# Patient Record
Sex: Male | Born: 1985 | Race: White | Hispanic: No | Marital: Married | Smoking: Former smoker
Health system: Southern US, Community
[De-identification: ages and names within clinical notes are randomized; demographics above are authoritative.]

## PROBLEM LIST (undated history)

## (undated) DIAGNOSIS — F32A Depression, unspecified: Secondary | ICD-10-CM

## (undated) DIAGNOSIS — F329 Major depressive disorder, single episode, unspecified: Secondary | ICD-10-CM

## (undated) HISTORY — DX: Depression, unspecified: F32.A

## (undated) HISTORY — DX: Major depressive disorder, single episode, unspecified: F32.9

---

## 2007-02-27 ENCOUNTER — Emergency Department: Payer: Self-pay | Admitting: Emergency Medicine

## 2011-04-30 ENCOUNTER — Ambulatory Visit: Payer: Self-pay | Admitting: Internal Medicine

## 2011-11-26 IMAGING — CR DG SHOULDER 3+V*R*
1 series · 4 of 4 positions shown · non-contrast
Comparison: none

REASON FOR EXAM: sudden pain radiating to elbow after swinging baseball
bat
COMMENTS:   LMP: (Male)

PROCEDURE:     MDR - MDR SHOULDER RIGHT COMPLETE  - April 30, 2011  [DATE]
RESULT:     There is no evidence of fracture, dislocation, or malalignment.

[Series 1: view not recorded · 0.17mm/px · 4 of 4 slices shown]
[im 1/4]
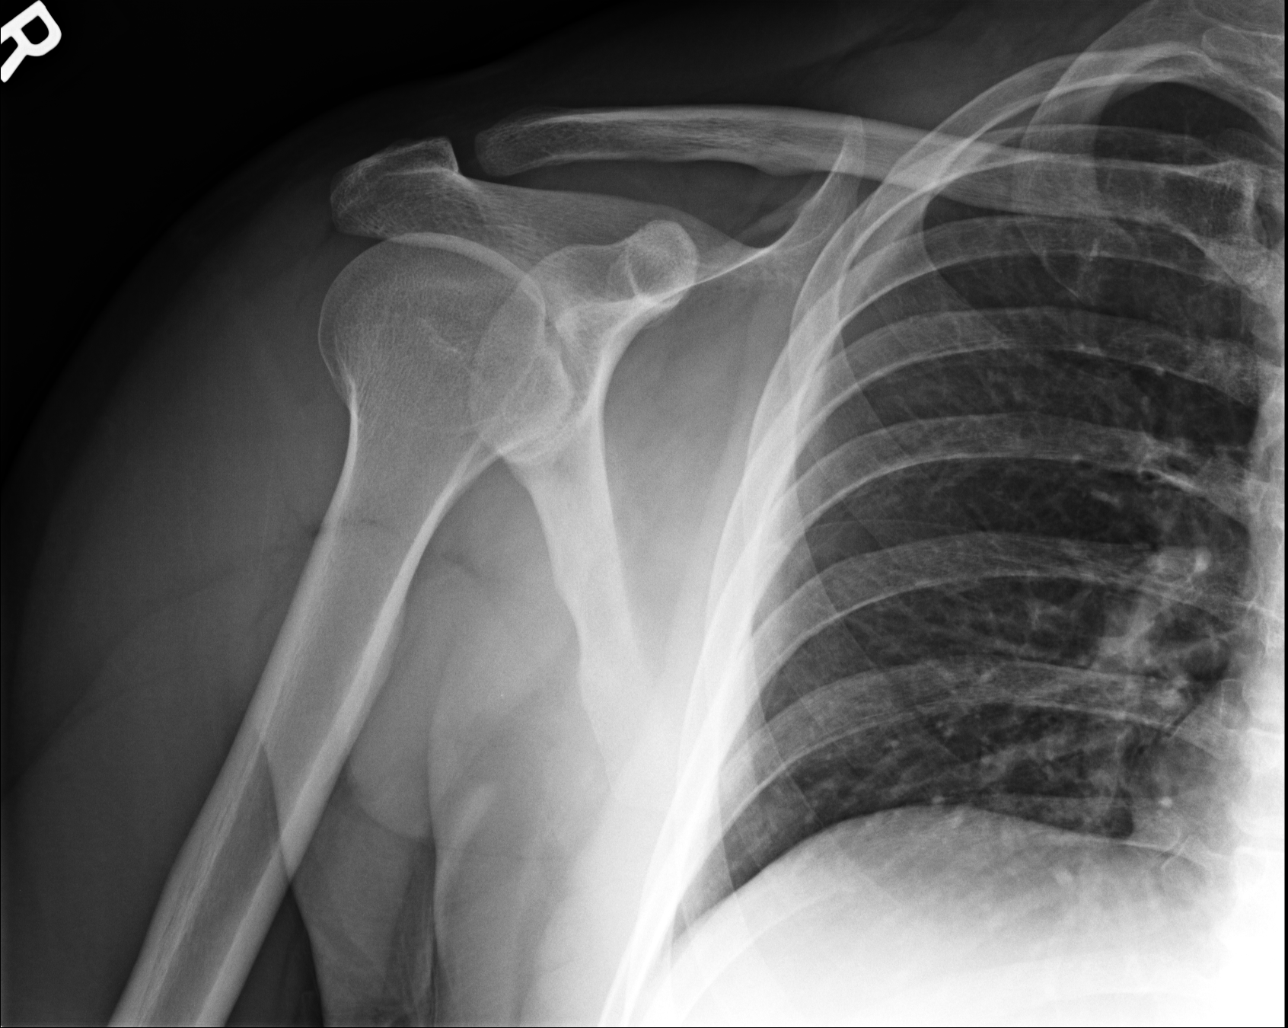
[im 2/4]
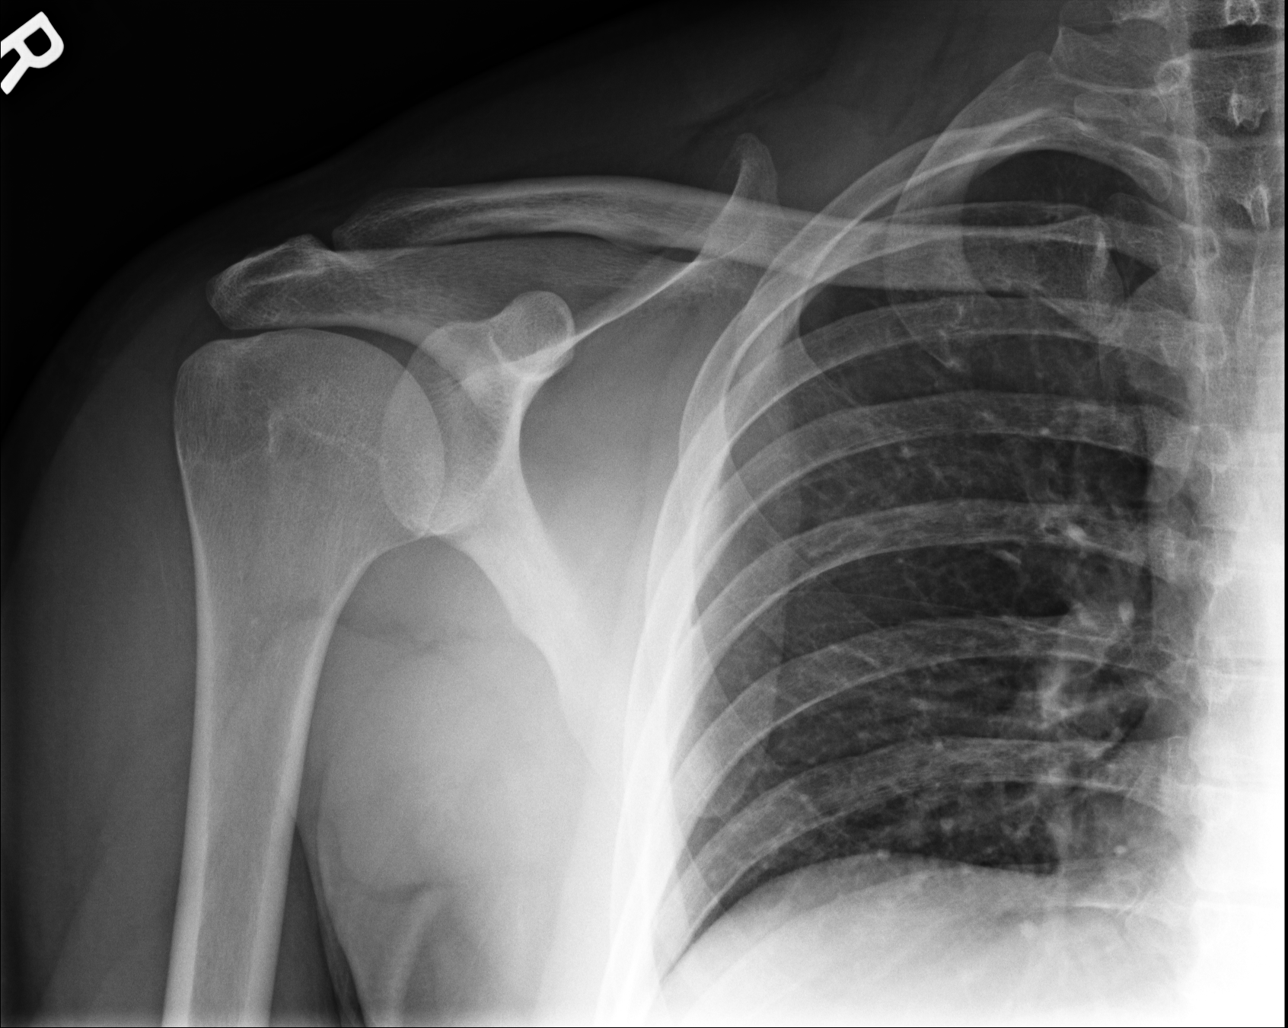
[im 3/4]
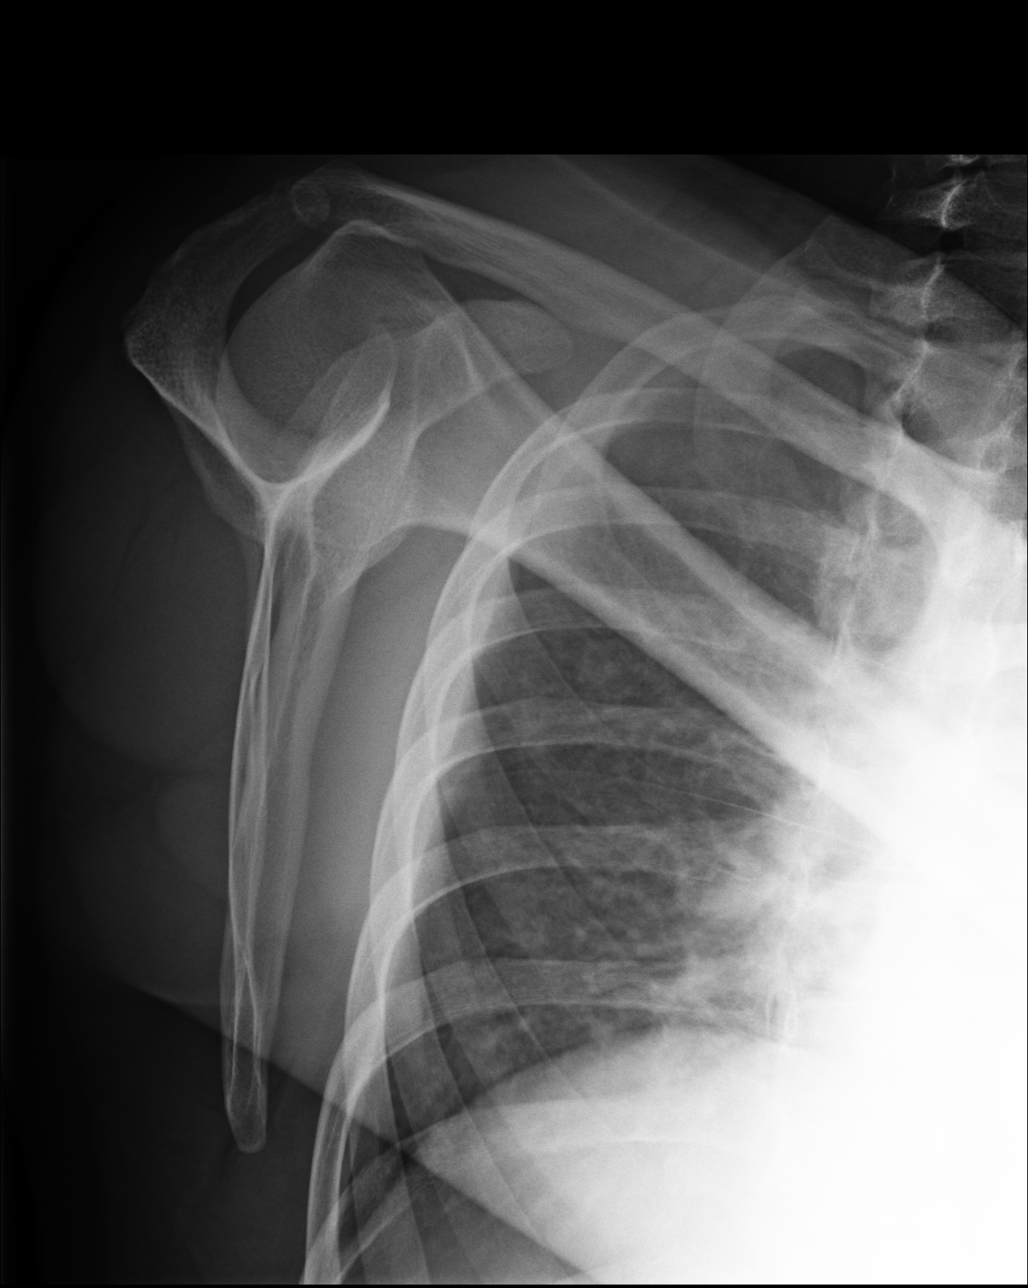
[im 4/4]
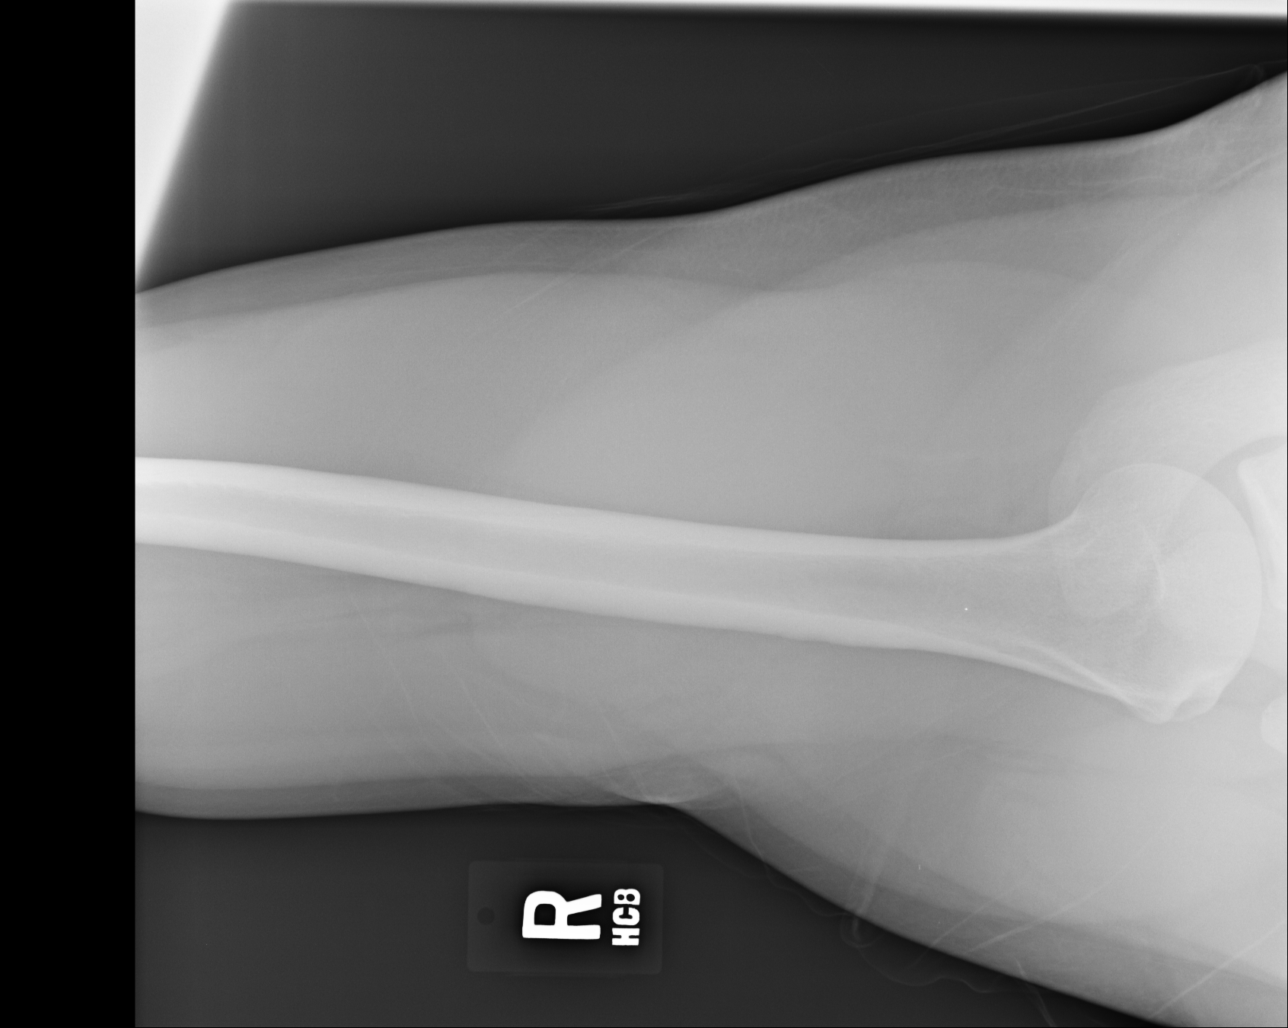

[4 of 4 positions shown; findings below may reference images not displayed]

IMPRESSION: 1. No evidence of acute abnormalities.
2. If there are persistent complaints of pain or persistent clinical
concern, a repeat evaluation in 7-10 days is recommended if clinically
warranted.

## 2012-09-11 ENCOUNTER — Emergency Department: Payer: Self-pay | Admitting: Emergency Medicine

## 2012-09-12 ENCOUNTER — Emergency Department: Payer: Self-pay | Admitting: Emergency Medicine

## 2018-04-03 ENCOUNTER — Ambulatory Visit: Payer: Self-pay | Admitting: Internal Medicine

## 2018-04-03 ENCOUNTER — Telehealth: Payer: Self-pay | Admitting: Internal Medicine

## 2018-04-03 DIAGNOSIS — Z0289 Encounter for other administrative examinations: Secondary | ICD-10-CM

## 2018-04-03 NOTE — Telephone Encounter (Signed)
Do not charge NSF  Copied from CRM [id number]. Topic: Quick Communication - Appointment Cancellation >> Apr 03, 2018  1:11 PM Crist InfanteHarrald, Kathy J wrote: Patient called to cancel appointment scheduled for 12 ;15 today,   Patient has rescheduled their appointment. Pt states he pushed the number to cancel this appt and was waiting for a call back to reschedule, but no one has called.  Advised his appt did not get cancelled,  was still scheduled for today. [place]   Pt states he knows regina personally and would not do that. Route to department's PEC pool. >> Apr 03, 2018  2:48 PM Carrie MewHayes, Robin B wrote: No show charge??

## 2018-04-12 ENCOUNTER — Encounter: Payer: Self-pay | Admitting: Internal Medicine

## 2018-04-12 ENCOUNTER — Ambulatory Visit: Payer: Self-pay | Admitting: Internal Medicine

## 2018-04-12 VITALS — BP 136/90 | HR 94 | Temp 98.5°F | Ht 68.5 in | Wt 206.0 lb

## 2018-04-12 DIAGNOSIS — F339 Major depressive disorder, recurrent, unspecified: Secondary | ICD-10-CM

## 2018-04-12 DIAGNOSIS — F900 Attention-deficit hyperactivity disorder, predominantly inattentive type: Secondary | ICD-10-CM | POA: Insufficient documentation

## 2018-04-12 MED ORDER — AMPHETAMINE-DEXTROAMPHETAMINE 10 MG PO TABS: 10.0000 mg | ORAL_TABLET | Freq: Every day | ORAL | 0 refills | Status: DC

## 2018-04-12 NOTE — Progress Notes (Signed)
HPI  Pt presents to the clinic today to establish care and for management of the conditions listed below. He is transferring care from [facility]. He also follows with the [facility].  Depression/PTSD: Triggering by [employer]. He was being treated by the [place]. He has not been able to tolerate Zoloft, Lexapro, and Celexa due to side effects. He denies SI/HI.  He also reports difficulty concentrating and trouble completing tasks. He was diagnosed with ADHD as a child, but has not taken medication in a long time. He would like to get restarted on Adderall today.  Flu: 09/2016 Tetanus: < 10 years ago Dentist: biannually  Past Medical History:  Diagnosis Date  . Depression     No current outpatient medications on file.   No current facility-administered medications for this visit.     Allergies  Allergen Reactions  . Celexa [Citalopram]     Restless legs    Family History  Problem Relation Age of Onset  . Breast cancer Mother   . Hypertension Mother   . Diabetes Maternal Grandmother   . Diabetes Maternal Grandfather     Social History   Socioeconomic History  . Marital status: Married    Spouse name: Not on file  . Number of children: Not on file  . Years of education: Not on file  . Highest education level: Not on file  Occupational History  . Not on file  Social Needs  . Financial resource strain: Not on file  . Food insecurity:    Worry: Not on file    Inability: Not on file  . Transportation needs:    Medical: Not on file    Non-medical: Not on file  Tobacco Use  . Smoking status: Former Smoker    Types: Cigarettes  . Smokeless tobacco: Never Used  . Tobacco comment: recently quit April 2019  Substance and Sexual Activity  . Alcohol use: Never    Frequency: Never  . Drug use: Not Currently  . Sexual activity: Not on file  Lifestyle  . Physical activity:    Days per week: Not on file    Minutes per session: Not on file  . Stress: Not on file   Relationships  . Social connections:    Talks on phone: Not on file    Gets together: Not on file    Attends religious service: Not on file    Active member of club or organization: Not on file    Attends meetings of clubs or organizations: Not on file    Relationship status: Not on file  . Intimate partner violence:    Fear of current or ex partner: Not on file    Emotionally abused: Not on file    Physically abused: Not on file    Forced sexual activity: Not on file  Other Topics Concern  . Not on file  Social History Narrative  . Not on file    ROS:  Constitutional: Denies fever, malaise, fatigue, headache or abrupt weight changes.  Respiratory: Denies difficulty breathing, shortness of breath, cough or sputum production.   Cardiovascular: Denies chest pain, chest tightness, palpitations or swelling in the hands or feet.  Neurological: Pt reports difficulty concentrating and completing tasks. Denies dizziness, difficulty with memory, difficulty with speech or problems with balance and coordination.  Psych: Pt has a history of depression. Denies anxiety, SI/HI.  No other specific complaints in a complete review of systems (except as listed in HPI above).  PE:  BP 136/90   Pulse 94   Temp 98.5 F (36.9 C) (Oral)   Ht 5' 8.5" (1.74 m)   Wt 206 lb (93.4 kg)   SpO2 98%   BMI 30.87 kg/m  Wt Readings from Last 3 Encounters:  04/12/18 206 lb (93.4 kg)    General: Appears his stated age, well developed, well nourished in NAD. Skin: Dry and intact. Cardiovascular: Normal rate and rhythm. S1,S2 noted.  No murmur, rubs or gallops noted.  Pulmonary/Chest: Normal effort and positive vesicular breath sounds. No respiratory distress. No wheezes, rales or ronchi noted.  Neurological: Alert and oriented.  Psychiatric: Mood and affect normal. Behavior is normal. Judgment and thought content normal.     Assessment and Plan:

## 2018-04-12 NOTE — Patient Instructions (Signed)

## 2018-04-12 NOTE — Assessment & Plan Note (Signed)
Will restart Adderall 10 mg daily

## 2018-04-12 NOTE — Assessment & Plan Note (Signed)
Support offered today He has failed multiple medications in the past Consider Wellbutrin if deteriorates Will monitor

## 2018-05-09 ENCOUNTER — Encounter: Payer: Self-pay | Admitting: Internal Medicine

## 2018-05-09 MED ORDER — AMPHETAMINE-DEXTROAMPHET ER 20 MG PO CP24: 20.0000 mg | ORAL_CAPSULE | ORAL | 0 refills | Status: DC

## 2018-05-09 NOTE — Addendum Note (Signed)
Addended by: Lorre Munroe on: 05/09/2018 03:06 PM   Modules accepted: Orders

## 2018-06-05 ENCOUNTER — Other Ambulatory Visit: Payer: Self-pay | Admitting: Internal Medicine

## 2018-06-05 ENCOUNTER — Encounter: Payer: Self-pay | Admitting: Internal Medicine

## 2018-06-05 MED ORDER — AMPHETAMINE-DEXTROAMPHETAMINE 30 MG PO TABS: 30.0000 mg | ORAL_TABLET | Freq: Every day | ORAL | 0 refills | Status: DC

## 2018-06-08 ENCOUNTER — Encounter: Payer: Self-pay | Admitting: Internal Medicine

## 2018-06-08 NOTE — Telephone Encounter (Signed)
Per Sampson SiBaity-- okay for pt to come in for nurse visit to get a regular Tetanus vaccine

## 2018-06-08 NOTE — Telephone Encounter (Signed)
Pt est care 04/2018... Please advise if okay for pt to some in for nurse visit to receive Tetanus

## 2018-07-10 ENCOUNTER — Other Ambulatory Visit: Payer: Self-pay | Admitting: Internal Medicine

## 2018-07-10 MED ORDER — AMPHETAMINE-DEXTROAMPHETAMINE 30 MG PO TABS: 30.0000 mg | ORAL_TABLET | Freq: Every day | ORAL | 0 refills | Status: DC

## 2018-07-10 NOTE — Telephone Encounter (Signed)
Name of Medication: Adderall 30 mg Name of Pharmacy: [facility] [place] Last Fill or Written Date and Quantity: # 30 on 06/05/18 Last Office Visit and Type: 04/12/18 establish care Next Office Visit and Type: none scheduled.

## 2018-07-18 ENCOUNTER — Ambulatory Visit: Payer: Self-pay

## 2018-08-08 ENCOUNTER — Encounter: Payer: Self-pay | Admitting: Internal Medicine

## 2018-08-08 MED ORDER — AMPHETAMINE-DEXTROAMPHETAMINE 15 MG PO TABS: 15.0000 mg | ORAL_TABLET | Freq: Every day | ORAL | 0 refills | Status: DC

## 2018-08-08 MED ORDER — AMPHETAMINE-DEXTROAMPHETAMINE 30 MG PO TABS: 30.0000 mg | ORAL_TABLET | Freq: Every day | ORAL | 0 refills | Status: DC

## 2018-09-08 ENCOUNTER — Other Ambulatory Visit: Payer: Self-pay | Admitting: Internal Medicine

## 2018-09-08 DIAGNOSIS — Z79899 Other long term (current) drug therapy: Secondary | ICD-10-CM

## 2018-09-08 NOTE — Telephone Encounter (Signed)
Last filled for 08/10/18... Note to pharmacy [facility] on or after 09/10/18... Please advise

## 2018-09-09 MED ORDER — AMPHETAMINE-DEXTROAMPHETAMINE 15 MG PO TABS: 15.0000 mg | ORAL_TABLET | Freq: Every day | ORAL | 0 refills | Status: AC

## 2018-09-09 MED ORDER — AMPHETAMINE-DEXTROAMPHETAMINE 30 MG PO TABS: 30.0000 mg | ORAL_TABLET | Freq: Every day | ORAL | 0 refills | Status: AC

## 2018-09-11 NOTE — Addendum Note (Signed)
Addended by: Roena Malady on: 09/11/2018 08:12 AM   Modules accepted: Orders
